# Patient Record
Sex: Male | Born: 1950 | Hispanic: Refuse to answer | Marital: Single
Health system: Southern US, Community
[De-identification: ages and names within clinical notes are randomized; demographics above are authoritative.]

## PROBLEM LIST (undated history)

## (undated) DIAGNOSIS — I10 Essential (primary) hypertension: Secondary | ICD-10-CM

---

## 2018-05-13 ENCOUNTER — Emergency Department (HOSPITAL_COMMUNITY): Payer: Self-pay | Admitting: Anesthesiology

## 2018-05-13 ENCOUNTER — Emergency Department (HOSPITAL_COMMUNITY): Payer: Self-pay

## 2018-05-13 ENCOUNTER — Encounter (HOSPITAL_COMMUNITY)
Admission: EM | Disposition: A | Discharge status: Home / Self Care | Payer: Self-pay | Source: Home / Self Care | Attending: Emergency Medicine

## 2018-05-13 ENCOUNTER — Encounter (HOSPITAL_COMMUNITY): Payer: Self-pay | Admitting: Emergency Medicine

## 2018-05-13 ENCOUNTER — Ambulatory Visit (HOSPITAL_COMMUNITY)
Admission: EM | Admit: 2018-05-13 | Discharge: 2018-05-13 | Disposition: A | Discharge status: Home / Self Care | Payer: Self-pay | Attending: Emergency Medicine | Admitting: Emergency Medicine

## 2018-05-13 DIAGNOSIS — Z7982 Long term (current) use of aspirin: Secondary | ICD-10-CM | POA: Insufficient documentation

## 2018-05-13 DIAGNOSIS — Z79899 Other long term (current) drug therapy: Secondary | ICD-10-CM | POA: Insufficient documentation

## 2018-05-13 DIAGNOSIS — S86821A Laceration of other muscle(s) and tendon(s) at lower leg level, right leg, initial encounter: Secondary | ICD-10-CM | POA: Insufficient documentation

## 2018-05-13 DIAGNOSIS — T1490XA Injury, unspecified, initial encounter: Secondary | ICD-10-CM

## 2018-05-13 DIAGNOSIS — I10 Essential (primary) hypertension: Secondary | ICD-10-CM | POA: Insufficient documentation

## 2018-05-13 DIAGNOSIS — W293XXA Contact with powered garden and outdoor hand tools and machinery, initial encounter: Secondary | ICD-10-CM

## 2018-05-13 DIAGNOSIS — S81811A Laceration without foreign body, right lower leg, initial encounter: Secondary | ICD-10-CM | POA: Insufficient documentation

## 2018-05-13 DIAGNOSIS — W458XXA Other foreign body or object entering through skin, initial encounter: Secondary | ICD-10-CM | POA: Insufficient documentation

## 2018-05-13 HISTORY — PX: I & D EXTREMITY: SHX5045

## 2018-05-13 HISTORY — DX: Essential (primary) hypertension: I10

## 2018-05-13 LAB — ABO/RH: ABO/RH(D): A POS

## 2018-05-13 LAB — BASIC METABOLIC PANEL
ANION GAP: 7 (ref 5–15)
BUN: 13 mg/dL (ref 8–23)
CO2: 23 mmol/L (ref 22–32)
Calcium: 9.2 mg/dL (ref 8.9–10.3)
Chloride: 104 mmol/L (ref 98–111)
Creatinine, Ser: 0.82 mg/dL (ref 0.61–1.24)
GFR calc Af Amer: >60 mL/min (ref >60)
GFR calc non Af Amer: >60 mL/min (ref >60)
Glucose, Bld: 146 mg/dL — ABNORMAL HIGH (ref 70–99)
Potassium: 3.6 mmol/L (ref 3.5–5.1)
Sodium: 134 mmol/L — ABNORMAL LOW (ref 135–145)

## 2018-05-13 LAB — CBC
HCT: 41.9 % (ref 39.0–52.0)
Hemoglobin: 13.9 g/dL (ref 13.0–17.0)
MCH: 31.4 pg (ref 26.0–34.0)
MCHC: 33.2 g/dL (ref 30.0–36.0)
MCV: 94.8 fL (ref 80.0–100.0)
NRBC: 0 % (ref 0.0–0.2)
Platelets: 258 10*3/uL (ref 150–400)
RBC: 4.42 MIL/uL (ref 4.22–5.81)
RDW: 12.9 % (ref 11.5–15.5)
WBC: 11.6 10*3/uL — ABNORMAL HIGH (ref 4.0–10.5)

## 2018-05-13 LAB — TYPE AND SCREEN
ABO/RH(D): A POS
ANTIBODY SCREEN: NEGATIVE

## 2018-05-13 LAB — PROTIME-INR
INR: 0.9
Prothrombin Time: 12.1 s (ref 11.4–15.2)

## 2018-05-13 LAB — CBG MONITORING, ED: Glucose-Capillary: 86 mg/dL (ref 70–99)

## 2018-05-13 SURGERY — IRRIGATION AND DEBRIDEMENT EXTREMITY
Anesthesia: General | Laterality: Right

## 2018-05-13 MED ORDER — VANCOMYCIN HCL 1000 MG IV SOLR
INTRAVENOUS | Status: AC
  Filled 2018-05-13: qty 1000

## 2018-05-13 MED ORDER — BUPIVACAINE HCL (PF) 0.25 % IJ SOLN
INTRAMUSCULAR | Status: AC
  Filled 2018-05-13: qty 30

## 2018-05-13 MED ORDER — FENTANYL CITRATE (PF) 250 MCG/5ML IJ SOLN
INTRAMUSCULAR | Status: DC | PRN
  Administered 2018-05-13: 50 ug via INTRAVENOUS
  Administered 2018-05-13: 100 ug via INTRAVENOUS

## 2018-05-13 MED ORDER — MORPHINE SULFATE (PF) 4 MG/ML IV SOLN
4.0000 mg | Freq: Once | INTRAVENOUS | Status: AC
  Administered 2018-05-13: 4 mg via INTRAVENOUS
  Filled 2018-05-13: qty 1

## 2018-05-13 MED ORDER — PROPOFOL 10 MG/ML IV BOLUS
INTRAVENOUS | Status: DC | PRN
  Administered 2018-05-13: 160 mg via INTRAVENOUS

## 2018-05-13 MED ORDER — ROCURONIUM BROMIDE 50 MG/5ML IV SOSY
PREFILLED_SYRINGE | INTRAVENOUS | Status: AC
  Filled 2018-05-13: qty 5

## 2018-05-13 MED ORDER — MIDAZOLAM HCL 5 MG/5ML IJ SOLN
INTRAMUSCULAR | Status: DC | PRN
  Administered 2018-05-13 (×2): 1 mg via INTRAVENOUS

## 2018-05-13 MED ORDER — VANCOMYCIN HCL 1000 MG IV SOLR
INTRAVENOUS | Status: DC | PRN
  Administered 2018-05-13: 1000 mg

## 2018-05-13 MED ORDER — TOBRAMYCIN SULFATE 1.2 G IJ SOLR
INTRAMUSCULAR | Status: AC
  Filled 2018-05-13: qty 1.2

## 2018-05-13 MED ORDER — MEPERIDINE HCL 50 MG/ML IJ SOLN: 6.2500 mg | INTRAMUSCULAR | Status: DC | PRN

## 2018-05-13 MED ORDER — PROPOFOL 10 MG/ML IV BOLUS
INTRAVENOUS | Status: AC
  Filled 2018-05-13: qty 20

## 2018-05-13 MED ORDER — ONDANSETRON HCL 4 MG/2ML IJ SOLN
4.0000 mg | Freq: Once | INTRAMUSCULAR | Status: AC
  Administered 2018-05-13: 4 mg via INTRAVENOUS
  Filled 2018-05-13: qty 2

## 2018-05-13 MED ORDER — EPHEDRINE 5 MG/ML INJ
INTRAVENOUS | Status: AC
  Filled 2018-05-13: qty 10

## 2018-05-13 MED ORDER — OXYCODONE HCL 5 MG PO TABS
5.0000 mg | ORAL_TABLET | Freq: Once | ORAL | Status: AC
  Administered 2018-05-13: 5 mg via ORAL

## 2018-05-13 MED ORDER — CEFAZOLIN SODIUM-DEXTROSE 2-3 GM-%(50ML) IV SOLR
INTRAVENOUS | Status: DC | PRN
  Administered 2018-05-13: 2 g via INTRAVENOUS

## 2018-05-13 MED ORDER — SODIUM CHLORIDE 0.9 % IV BOLUS (SEPSIS)
500.0000 mL | Freq: Once | INTRAVENOUS | Status: AC
  Administered 2018-05-13: 500 mL via INTRAVENOUS

## 2018-05-13 MED ORDER — ARTIFICIAL TEARS OPHTHALMIC OINT
TOPICAL_OINTMENT | OPHTHALMIC | Status: AC
  Filled 2018-05-13: qty 3.5

## 2018-05-13 MED ORDER — DEXAMETHASONE SODIUM PHOSPHATE 10 MG/ML IJ SOLN
INTRAMUSCULAR | Status: AC
  Filled 2018-05-13: qty 1

## 2018-05-13 MED ORDER — OXYCODONE HCL 5 MG PO TABS: ORAL_TABLET | ORAL | 0 refills | Status: AC

## 2018-05-13 MED ORDER — TOBRAMYCIN SULFATE 1.2 G IJ SOLR
INTRAMUSCULAR | Status: DC | PRN
  Administered 2018-05-13: 1.2 g

## 2018-05-13 MED ORDER — BUPIVACAINE HCL (PF) 0.25 % IJ SOLN
INTRAMUSCULAR | Status: AC
  Filled 2018-05-13: qty 10

## 2018-05-13 MED ORDER — ONDANSETRON HCL 4 MG/2ML IJ SOLN
INTRAMUSCULAR | Status: DC | PRN
  Administered 2018-05-13: 4 mg via INTRAVENOUS

## 2018-05-13 MED ORDER — HYDROMORPHONE HCL 1 MG/ML IJ SOLN: 0.2500 mg | INTRAMUSCULAR | Status: DC | PRN

## 2018-05-13 MED ORDER — ONDANSETRON HCL 4 MG/2ML IJ SOLN
INTRAMUSCULAR | Status: AC
  Filled 2018-05-13: qty 2

## 2018-05-13 MED ORDER — PROMETHAZINE HCL 25 MG/ML IJ SOLN: 6.2500 mg | INTRAMUSCULAR | Status: DC | PRN

## 2018-05-13 MED ORDER — SUCCINYLCHOLINE CHLORIDE 20 MG/ML IJ SOLN
INTRAMUSCULAR | Status: DC | PRN
  Administered 2018-05-13: 100 mg via INTRAVENOUS

## 2018-05-13 MED ORDER — SODIUM CHLORIDE 0.9 % IR SOLN
Status: DC | PRN
  Administered 2018-05-13 (×3): 3000 mL

## 2018-05-13 MED ORDER — ONDANSETRON HCL 4 MG PO TABS: 4.0000 mg | ORAL_TABLET | Freq: Three times a day (TID) | ORAL | 1 refills | Status: AC | PRN

## 2018-05-13 MED ORDER — LACTATED RINGERS IV SOLN: INTRAVENOUS | Status: DC

## 2018-05-13 MED ORDER — BUPIVACAINE HCL 0.25 % IJ SOLN
INTRAMUSCULAR | Status: DC | PRN
  Administered 2018-05-13: 30 mL

## 2018-05-13 MED ORDER — LACTATED RINGERS IV SOLN
INTRAVENOUS | Status: DC | PRN
  Administered 2018-05-13 (×2): via INTRAVENOUS

## 2018-05-13 MED ORDER — SUCCINYLCHOLINE CHLORIDE 200 MG/10ML IV SOSY
PREFILLED_SYRINGE | INTRAVENOUS | Status: AC
  Filled 2018-05-13: qty 10

## 2018-05-13 MED ORDER — FENTANYL CITRATE (PF) 250 MCG/5ML IJ SOLN
INTRAMUSCULAR | Status: AC
  Filled 2018-05-13: qty 5

## 2018-05-13 MED ORDER — MIDAZOLAM HCL 2 MG/2ML IJ SOLN
INTRAMUSCULAR | Status: AC
  Filled 2018-05-13: qty 2

## 2018-05-13 MED ORDER — ACETAMINOPHEN 500 MG PO TABS: 1000.0000 mg | ORAL_TABLET | Freq: Three times a day (TID) | ORAL | 0 refills | Status: AC

## 2018-05-13 MED ORDER — LIDOCAINE HCL (CARDIAC) PF 100 MG/5ML IV SOSY
PREFILLED_SYRINGE | INTRAVENOUS | Status: DC | PRN
  Administered 2018-05-13: 60 mg via INTRATRACHEAL

## 2018-05-13 MED ORDER — ASPIRIN EC 325 MG PO TBEC: 325.0000 mg | DELAYED_RELEASE_TABLET | Freq: Every day | ORAL | 0 refills | Status: AC

## 2018-05-13 MED ORDER — TOBRAMYCIN SULFATE 80 MG/2ML IJ SOLN
INTRAMUSCULAR | Status: AC
  Filled 2018-05-13: qty 2

## 2018-05-13 MED ORDER — LIDOCAINE 2% (20 MG/ML) 5 ML SYRINGE
INTRAMUSCULAR | Status: AC
  Filled 2018-05-13: qty 5

## 2018-05-13 MED ORDER — SODIUM CHLORIDE (PF) 0.9 % IJ SOLN
INTRAMUSCULAR | Status: AC
  Filled 2018-05-13: qty 10

## 2018-05-13 MED ORDER — OXYCODONE HCL 5 MG PO TABS
ORAL_TABLET | ORAL | Status: AC
  Filled 2018-05-13: qty 1

## 2018-05-13 MED ORDER — SODIUM CHLORIDE 0.9 % IV SOLN: 1000.0000 mL | INTRAVENOUS | Status: DC

## 2018-05-13 MED ORDER — EPHEDRINE SULFATE 50 MG/ML IJ SOLN
INTRAMUSCULAR | Status: DC | PRN
  Administered 2018-05-13: 10 mg via INTRAVENOUS

## 2018-05-13 SURGICAL SUPPLY — 57 items
BANDAGE ACE 4X5 VEL STRL LF (GAUZE/BANDAGES/DRESSINGS) ×3 IMPLANT
BANDAGE ACE 6X5 VEL STRL LF (GAUZE/BANDAGES/DRESSINGS) ×9 IMPLANT
BNDG COHESIVE 6X5 TAN STRL LF (GAUZE/BANDAGES/DRESSINGS) ×3 IMPLANT
BNDG GAUZE ELAST 4 BULKY (GAUZE/BANDAGES/DRESSINGS) ×3 IMPLANT
CONT SPEC 4OZ CLIKSEAL STRL BL (MISCELLANEOUS) IMPLANT
COVER SURGICAL LIGHT HANDLE (MISCELLANEOUS) ×3 IMPLANT
COVER WAND RF STERILE (DRAPES) ×3 IMPLANT
CUFF TOURN SGL LL 12 NO SLV (MISCELLANEOUS) IMPLANT
CUFF TOURNIQUET SINGLE 34IN LL (TOURNIQUET CUFF) IMPLANT
DRAPE U-SHAPE 47X51 STRL (DRAPES) ×3 IMPLANT
DRSG PAD ABDOMINAL 8X10 ST (GAUZE/BANDAGES/DRESSINGS) ×6 IMPLANT
DURAPREP 26ML APPLICATOR (WOUND CARE) IMPLANT
ELECT REM PT RETURN 9FT ADLT (ELECTROSURGICAL)
ELECTRODE REM PT RTRN 9FT ADLT (ELECTROSURGICAL) IMPLANT
GAUZE SPONGE 4X4 12PLY STRL (GAUZE/BANDAGES/DRESSINGS) ×3 IMPLANT
GAUZE XEROFORM 1X8 LF (GAUZE/BANDAGES/DRESSINGS) ×3 IMPLANT
GAUZE XEROFORM 5X9 LF (GAUZE/BANDAGES/DRESSINGS) ×3 IMPLANT
GLOVE BIOGEL PI IND STRL 6.5 (GLOVE) ×1 IMPLANT
GLOVE BIOGEL PI IND STRL 7.0 (GLOVE) ×1 IMPLANT
GLOVE BIOGEL PI IND STRL 7.5 (GLOVE) ×1 IMPLANT
GLOVE BIOGEL PI IND STRL 8 (GLOVE) ×3 IMPLANT
GLOVE BIOGEL PI INDICATOR 6.5 (GLOVE) ×2
GLOVE BIOGEL PI INDICATOR 7.0 (GLOVE) ×2
GLOVE BIOGEL PI INDICATOR 7.5 (GLOVE) ×2
GLOVE BIOGEL PI INDICATOR 8 (GLOVE) ×6
GLOVE BIOGEL PI ORTHO PRO SZ8 (GLOVE) ×4
GLOVE ECLIPSE 8.0 STRL XLNG CF (GLOVE) ×12 IMPLANT
GLOVE PI ORTHO PRO STRL SZ8 (GLOVE) ×2 IMPLANT
GOWN STRL REUS W/ TWL LRG LVL3 (GOWN DISPOSABLE) ×1 IMPLANT
GOWN STRL REUS W/ TWL XL LVL3 (GOWN DISPOSABLE) ×2 IMPLANT
GOWN STRL REUS W/TWL LRG LVL3 (GOWN DISPOSABLE) ×2
GOWN STRL REUS W/TWL XL LVL3 (GOWN DISPOSABLE) ×4
HANDPIECE INTERPULSE COAX TIP (DISPOSABLE)
KIT BASIN OR (CUSTOM PROCEDURE TRAY) ×3 IMPLANT
KIT TURNOVER KIT B (KITS) ×3 IMPLANT
MANIFOLD NEPTUNE II (INSTRUMENTS) ×3 IMPLANT
NEEDLE HYPO 25GX1X1/2 BEV (NEEDLE) ×3 IMPLANT
NS IRRIG 1000ML POUR BTL (IV SOLUTION) ×3 IMPLANT
PACK ORTHO EXTREMITY (CUSTOM PROCEDURE TRAY) ×3 IMPLANT
PAD ARMBOARD 7.5X6 YLW CONV (MISCELLANEOUS) ×3 IMPLANT
PAD CAST 4YDX4 CTTN HI CHSV (CAST SUPPLIES) ×2 IMPLANT
PADDING CAST COTTON 4X4 STRL (CAST SUPPLIES) ×4
PADDING CAST COTTON 6X4 STRL (CAST SUPPLIES) ×3 IMPLANT
SET HNDPC FAN SPRY TIP SCT (DISPOSABLE) IMPLANT
SPONGE LAP 18X18 X RAY DECT (DISPOSABLE) ×3 IMPLANT
SUT ETHILON 2 0 FS 18 (SUTURE) ×12 IMPLANT
SUT ETHILON 2 LR (SUTURE) ×3 IMPLANT
SUT ETHILON 3 0 PS 1 (SUTURE) ×3 IMPLANT
SUT PDS AB 0 CT 36 (SUTURE) ×6 IMPLANT
SWAB CULTURE ESWAB REG 1ML (MISCELLANEOUS) IMPLANT
SYR CONTROL 10ML LL (SYRINGE) ×3 IMPLANT
TOWEL OR 17X24 6PK STRL BLUE (TOWEL DISPOSABLE) ×3 IMPLANT
TOWEL OR 17X26 10 PK STRL BLUE (TOWEL DISPOSABLE) ×3 IMPLANT
TUBE CONNECTING 12'X1/4 (SUCTIONS) ×1
TUBE CONNECTING 12X1/4 (SUCTIONS) ×2 IMPLANT
UNDERPAD 30X30 (UNDERPADS AND DIAPERS) ×3 IMPLANT
YANKAUER SUCT BULB TIP NO VENT (SUCTIONS) ×3 IMPLANT

## 2018-05-13 NOTE — Anesthesia Preprocedure Evaluation (Addendum)
Anesthesia Evaluation  Patient identified by MRN, date of birth, ID band Patient awake    Reviewed: Allergy & Precautions, NPO status , Patient's Chart, lab work & pertinent test results, reviewed documented beta blocker date and time   Airway Mallampati: III  TM Distance: >3 FB Neck ROM: Full    Dental  (+) Teeth Intact, Dental Advisory Given   Pulmonary neg pulmonary ROS,    breath sounds clear to auscultation       Cardiovascular hypertension, Pt. on medications and Pt. on home beta blockers  Rhythm:Regular Rate:Normal     Neuro/Psych negative neurological ROS     GI/Hepatic negative GI ROS, Neg liver ROS,   Endo/Other  negative endocrine ROS  Renal/GU negative Renal ROS     Musculoskeletal negative musculoskeletal ROS (+)   Abdominal Normal abdominal exam  (+)   Peds  Hematology negative hematology ROS (+)   Anesthesia Other Findings   Reproductive/Obstetrics                            Anesthesia Physical Anesthesia Plan  ASA: II and emergent  Anesthesia Plan: General   Post-op Pain Management:    Induction: Intravenous, Rapid sequence and Cricoid pressure planned  PONV Risk Score and Plan: 3 and Ondansetron, Dexamethasone and Midazolam  Airway Management Planned: Oral ETT  Additional Equipment: None  Intra-op Plan:   Post-operative Plan: Extubation in OR  Informed Consent: I have reviewed the patients History and Physical, chart, labs and discussed the procedure including the risks, benefits and alternatives for the proposed anesthesia with the patient or authorized representative who has indicated his/her understanding and acceptance.   Dental advisory given  Plan Discussed with: CRNA  Anesthesia Plan Comments:         Anesthesia Quick Evaluation

## 2018-05-13 NOTE — ED Provider Notes (Signed)
[facility] EMERGENCY DEPARTMENT Provider Note   CSN: [id number] Arrival date & time: 05/13/18  1820     History   Chief Complaint Chainsaw injury  HPI Wesley Olsen is a 68 y.o. male.  HPI Patient works as an Haematologist.  He was using a chainsaw when the chainsaw kicked back and caused a severe laceration to his lower leg with visible muscle exposed.  Patient denies any distal numbness or weakness.  Paramedics were contacted.  Patient was given a gram of Ancef.  EMS placed a tourniquet and it was on for at least 30 minutes prior to his arrival Past Medical History:  Diagnosis Date  . Hypertension     There are no active problems to display for this patient.   History reviewed. No pertinent surgical history.      Home Medications    Prior to Admission medications   Not on File    Family History History reviewed. No pertinent family history.  Social History Social History   Tobacco Use  . Smoking status: Not on file  Substance Use Topics  . Alcohol use: Not on file  . Drug use: Not on file     Allergies   Patient has no known allergies.   Review of Systems Review of Systems  All other systems reviewed and are negative.    Physical Exam Updated Vital Signs BP 107/66   Pulse 66   Temp 99 F (37.2 C)   Resp 18   SpO2 100%   Physical Exam Vitals signs and nursing note reviewed.  Constitutional:      General: He is not in acute distress.    Appearance: He is well-developed.  HENT:     Head: Normocephalic and atraumatic.     Right Ear: External ear normal.     Left Ear: External ear normal.  Eyes:     General: No scleral icterus.       Right eye: No discharge.        Left eye: No discharge.     Conjunctiva/sclera: Conjunctivae normal.  Neck:     Musculoskeletal: Neck supple.     Trachea: No tracheal deviation.  Cardiovascular:     Rate and Rhythm: Normal rate and regular rhythm.  Pulmonary:     Effort: Pulmonary  effort is normal. No respiratory distress.     Breath sounds: No stridor.  Abdominal:     General: There is no distension.  Musculoskeletal:        General: Deformity present. No swelling.     Comments: Large laceration extending from the knee down to the distal third of the lower leg involving the gastrocnemius muscle, laceration does involve the muscle.  See attached image, Cabbell dorsalis pedis pulse  Skin:    General: Skin is warm and dry.     Findings: No rash.  Neurological:     Mental Status: He is alert.     Cranial Nerves: Cranial nerve deficit: no gross deficits.     Comments: Patient does have sensation in his foot, he is able to plantarflex, dorsiflex and wiggle his toes        ED Treatments / Results  Labs (all labs ordered are listed, but only abnormal results are displayed) Labs Reviewed  CBC  BASIC METABOLIC PANEL  PROTIME-INR  TYPE AND SCREEN     Radiology No results found.  Procedures Procedures (including critical care time)  Medications Ordered in ED Medications  sodium chloride 0.9 %  bolus 500 mL (500 mLs Intravenous New Bag/Given 05/13/18 1834)    Followed by  0.9 %  sodium chloride infusion (has no administration in time range)  morphine 4 MG/ML injection 4 mg (4 mg Intravenous Given 05/13/18 1827)  ondansetron (ZOFRAN) injection 4 mg (4 mg Intravenous Given 05/13/18 1827)     Initial Impression / Assessment and Plan / ED Course  I have reviewed the triage vital signs and the nursing notes.  Pertinent labs & imaging results that were available during my care of the patient were reviewed by me and considered in my medical decision making (see chart for details).  Clinical Course as of May 13 1850  Wynelle Link May 13, 2018  1850 Patient tourniquet was removed when he arrived   [JK]    Clinical Course User Index [JK] Linwood Dibbles, MD  Patient has a large laceration of his lower leg that involves the gastrocnemius muscle.  Pt will need OR irrigation and  repair. Case d/w Dr Everardo Pacific.  He will plan on taking him to the OR  Final Clinical Impressions(s) / ED Diagnoses   Final diagnoses:  Injury  Contact with chainsaw as cause of accidental injury    ED Discharge Orders    None       Linwood Dibbles, MD 05/13/18 [phone]

## 2018-05-13 NOTE — Progress Notes (Signed)
Discharge instruction given to Pt. And his daughter.  Printed out AVS spanish , also reminded his daughter to stay with Pt. 24hrs on discharge. VSS. Drank water without nausea.  Discharge home with family. Daughter requested throw away pt's clothes .

## 2018-05-13 NOTE — Transfer of Care (Signed)
Immediate Anesthesia Transfer of Care Note  Patient: Wesley Olsen  Procedure(s) Performed: IRRIGATION AND DEBRIDEMENT RIGHT LEG (Right )  Patient Location: PACU  Anesthesia Type:General  Level of Consciousness: drowsy  Airway & Oxygen Therapy: Patient Spontanous Breathing and Patient connected to face mask oxygen  Post-op Assessment: Report given to RN and Post -op Vital signs reviewed and stable  Post vital signs: Reviewed and stable  Last Vitals:  Vitals Value Taken Time  BP 122/70 05/13/2018  8:46 PM  Temp    Pulse 104 05/13/2018  8:48 PM  Resp 13 05/13/2018  8:48 PM  SpO2 98 % 05/13/2018  8:48 PM  Vitals shown include unvalidated device data.  Last Pain: There were no vitals filed for this visit.       Complications: No apparent anesthesia complications

## 2018-05-13 NOTE — Anesthesia Procedure Notes (Signed)
Procedure Name: Intubation Date/Time: 05/13/2018 7:37 PM Performed by: Claudina Lick, CRNA Pre-anesthesia Checklist: Patient identified, Emergency Drugs available, Suction available, Patient being monitored and Timeout performed Patient Re-evaluated:Patient Re-evaluated prior to induction Oxygen Delivery Method: Circle system utilized Preoxygenation: Pre-oxygenation with 100% oxygen Induction Type: IV induction, Rapid sequence and Cricoid Pressure applied Laryngoscope Size: Miller and 2 Grade View: Grade I Tube type: Oral Tube size: 7.5 mm Number of attempts: 1 Airway Equipment and Method: Stylet Placement Confirmation: ETT inserted through vocal cords under direct vision,  positive ETCO2 and breath sounds checked- equal and bilateral Secured at: 22 cm Tube secured with: Tape Dental Injury: Teeth and Oropharynx as per pre-operative assessment

## 2018-05-13 NOTE — ED Triage Notes (Signed)
Pt here from home with a lac  To the right lower calf , arrived from ems with tourniquet applied pt received 1 gram of ancef by ems

## 2018-05-13 NOTE — Op Note (Signed)
Orthopaedic Surgery Operative Note (CSN: [id number])  [name]  1950/12/21 Date of Surgery: 05/13/2018   Diagnoses:  chain saw to right leg  Procedure: 13121 - Incision and debridement of complex lower extremity wound 25 x 10 cm  13122 x 4 - additional codes for the length of the wound 40973 - musculotendinous repair of gastroc to Achilles 11043 - debridement of muscle and fascial tissue well skin.   Operative Finding Successful completion of planned procedure.  Overall mild contamination of the deep muscle no obvious foreign material.  The medial gastroc and its complete entirety was traumatically lacerated away from the Achilles.  The soleal fascia was lacerated by the chainsaw however the muscle itself was intact.  Were able to palpate the neurovascular bundle beneath this.  The muscle itself we felt would not of healed well with this we reapproximated the gastroc to the Achilles at the musculotendinous junction.  Post-operative plan: The patient will be nonweightbearing in a splint for 2 weeks.  The patient will be evaluated 1 week afterwards for a wound check, he will be discharged home at his request.  DVT prophylaxis aspirin 325 daily.  Pain control with PRN pain medication preferring oral medicines.  Follow up plan will be scheduled in approximately 7 days for incision check.   Post-Op Diagnosis: Same Surgeons:Primary: Wesley Pippin, MD Assistants: Wesley Olsen, OPAC Location: [facility] OR ROOM 08 Anesthesia: General Antibiotics: Ancef 2g preop, Vancomycin 1000mg  locally, tobramycin 1.2 g Tourniquet time: None Estimated Blood Loss: Minimal Complications: None Specimens: None Implants: * No implants in log *  Indications for Surgery:   Wesley Olsen is a 68 y.o. male with accident resulting in a chainsaw injury to the medial lower right leg.  He was evaluated in the emergency room and required a deep washout and wound closure due to the level contamination the size of the  wound.  With help and interpreter we discussed benefits and risks of operative and nonoperative management were discussed prior to surgery with patient/guardian(s) and informed consent form was completed.  Specific risks including infection, need for additional surgery, infection, muscle dysfunction, stiffness, need for further surgery including repeat washout and possible wound VAC placement.   Procedure:   The patient was identified in the preoperative holding area where the surgical site was marked. The patient was taken to the OR where a procedural timeout was called and the above noted anesthesia was induced.  The patient was positioned supine with a bump and a bone foam.  Preoperative antibiotics were dosed.  The patient's right lower leg was prepped and draped in the usual sterile fashion.  A second preoperative timeout was called.      No tourniquet was placed.  Patient preoperatively had good pulses and a warm well perfused foot with distal motor and sensory function was intact.  We identified the large traumatic laceration of the medial posterior aspect of the calf and measured 25x 10 cm in length.  It went deep clearly lacerating the entirety of the medial side of the gastroc from the Achilles at the musculotendinous junction and involving the fascia of the soleus.  The skin was macerated around the incision itself.  We sharply debrided unhealthy appearing muscle, fascia and skin.  We irrigated with 9 L of normal saline with cystoscopy tubing and debrided carefully as we proceeded to ensure there is no foreign bodies.  We really did not encounter much in the way of foreign contaminant in the wound bed was relatively healthy  appearing.  We again after irrigation checked her wound to make sure there was no other devitalized appearing tissue and then proceeded with closure attempts.  We felt that the wound was amenable to closure and the patient requested that we try and close the wound and send the  patient home if possible as he did not want to stay in the hospital.  We placed vancomycin and tobramycin powder deep within the laceration and performed a repair with 0 PDS of the medial gastroc with interrupted sutures in a figure-of-eight fashion across the muscular tendinous junction to repair the Achilles to the medial gastroc.  We did not want to use the typical nonabsorbable sutures in the setting of possible infection.  We then performed a single layer closure of the complex laceration of the skin.  This was done in conjunction with retention sutures as well as independent interrupted sutures.   A sterile dressing was placed.  A well-padded short leg splint was placed.  The patient was awoken from general anesthesia and taken to the PACU in stable condition without complication.   Wesley Olsen, OPA-C, present and scrubbed throughout the case, critical for completion in a timely fashion, and for retraction, instrumentation, closure.

## 2018-05-13 NOTE — Consult Note (Signed)
ORTHOPAEDIC CONSULTATION  REQUESTING PHYSICIAN: Dorie Rank, MD  Chief Complaint: R leg wound  HPI: Wesley Olsen is a 68 y.o. male with chainsaw versus RLE injury with a contaminated soft tissue injury.  ER wanted orthopedics evaluation due to the complexity and depth of the wound.  Patient has remote heart history but no other reporetd medical history.  History obtained with an interpreter.   Past Medical History:  Diagnosis Date  . Hypertension    History reviewed. No pertinent surgical history. Social History   Socioeconomic History  . Marital status: Not on file    Spouse name: Not on file  . Number of children: Not on file  . Years of education: Not on file  . Highest education level: Not on file  Occupational History  . Not on file  Social Needs  . Financial resource strain: Not on file  . Food insecurity:    Worry: Not on file    Inability: Not on file  . Transportation needs:    Medical: Not on file    Non-medical: Not on file  Tobacco Use  . Smoking status: Not on file  Substance and Sexual Activity  . Alcohol use: Not on file  . Drug use: Not on file  . Sexual activity: Not on file  Lifestyle  . Physical activity:    Days per week: Not on file    Minutes per session: Not on file  . Stress: Not on file  Relationships  . Social connections:    Talks on phone: Not on file    Gets together: Not on file    Attends religious service: Not on file    Active member of club or organization: Not on file    Attends meetings of clubs or organizations: Not on file    Relationship status: Not on file  Other Topics Concern  . Not on file  Social History Narrative  . Not on file   History reviewed. No pertinent family history. No Known Allergies Prior to Admission medications   Medication Sig Start Date End Date Taking? Authorizing Provider  Ascorbic Acid (VITAMIN C PO) Take 1 tablet by mouth daily.   Yes [provider]  aspirin EC 81 MG  tablet Take 81 mg by mouth daily.   Yes [provider]  atorvastatin (LIPITOR) 40 MG tablet Take 40 mg by mouth daily. 02/15/18  Yes [provider]  Cyanocobalamin (VITAMIN B-12 PO) Take 1 tablet by mouth daily.   Yes [provider]  lisinopril (PRINIVIL,ZESTRIL) 20 MG tablet Take 20 mg by mouth daily. 02/15/18  Yes [provider]  metoprolol succinate (TOPROL-XL) 50 MG 24 hr tablet Take 50 mg by mouth daily. 02/15/18  Yes [provider]  VITAMIN E PO Take 1 capsule by mouth daily.   Yes [provider]   Dg Tibia/fibula Right Port  Result Date: 05/13/2018 CLINICAL DATA:  Chainsaw injury to the medial, posterior aspect of the right lower leg. EXAM: PORTABLE RIGHT TIBIA AND FIBULA - 2 VIEW COMPARISON:  None. FINDINGS: Extensive soft tissue injury is noted to the posteromedial aspect of the right leg extending from just below the knee to the mid leg. There is no radiopaque foreign body. No fracture.  Knee and ankle joints are normally spaced and aligned. IMPRESSION: 1. Extensive soft tissue injury to the medial right leg. No radiopaque foreign body. 2. No fracture or joint abnormality Electronically Signed   By: Dedra Skeens.D.  On: 05/13/2018 18:51   Family History Reviewed and non-contributory, no pertinent history of problems with bleeding or anesthesia      Review of Systems 14 system ROS conducted and negative except for that noted in HPI   OBJECTIVE  Vitals: Patient Vitals for the past 8 hrs:  BP Temp Pulse Resp SpO2  05/13/18 1826 107/66 99 F (37.2 C) 66 18 100 %   General: Alert, no acute distress Cardiovascular: No pedal edema Respiratory: No cyanosis, no use of accessory musculature GI: No organomegaly, abdomen is soft and non-tender Skin: No lesions in the area of chief complaint other than those listed below in MSK exam.  Neurologic: Sensation intact distally save for the below mentioned MSK exam Psychiatric:  Patient is competent for consent with normal mood and affect Lymphatic: No swelling outside the above Extremities   RLE large medial wound with exposed muscle and tendon, intact distal NV status.  Wwp foot.     Test Results Imaging Trays demonstrate intact bone however large soft tissue shadow consistent with laceration  Labs cbc Recent Labs    05/13/18 1844  WBC 11.6*  HGB 13.9  HCT 41.9  PLT 258    Labs inflam No results for input(s): CRP in the last 72 hours.  Invalid input(s): ESR  Labs coag No results for input(s): INR, PTT in the last 72 hours.  Invalid input(s): PT  No results for input(s): NA, K, CL, CO2, GLUCOSE, BUN, CREATININE, CALCIUM in the last 72 hours.   ASSESSMENT AND PLAN: 68 y.o. male with the following: Right lower extremity chainsaw injury with contaminated wound and possible tendon and muscle involvement.  Help with an interpreter talk to the patient about the injury.  He will require an operative debridement and complex wound closure with possible wound VAC placement.  He may need a repeat debridement depending on the contamination level of the wound.  He would like to ideally avoid this and would like to go home after surgery if possible.  We told this is possible but we would have to see the extent of his injury more thoroughly in the operating room.  Risks including specific risk of muscle damage stiffness and considerable risk of infection were all discussed as well as the need for further surgery.  This was all discussed with an interpreter and the patient agreed to proceed.  Emergent surgery will proceed.

## 2018-05-14 ENCOUNTER — Encounter (HOSPITAL_COMMUNITY): Payer: Self-pay | Admitting: Orthopaedic Surgery

## 2018-05-15 NOTE — Anesthesia Postprocedure Evaluation (Signed)
Anesthesia Post Note  Patient: Wesley BoozeHector Olsen  Procedure(s) Performed: IRRIGATION AND DEBRIDEMENT RIGHT LEG (Right )     Patient location during evaluation: PACU Anesthesia Type: General Level of consciousness: awake and alert Pain management: pain level controlled Vital Signs Assessment: post-procedure vital signs reviewed and stable Respiratory status: spontaneous breathing, nonlabored ventilation, respiratory function stable and patient connected to nasal cannula oxygen Cardiovascular status: blood pressure returned to baseline and stable Postop Assessment: no apparent nausea or vomiting Anesthetic complications: no    Last Vitals:  Vitals:   05/13/18 2125 05/13/18 2127  BP: 135/80 135/80  Pulse: 89 90  Resp: 15 14  Temp:  36.7 C  SpO2: 100% 100%    Last Pain:  Vitals:   05/13/18 2130  PainSc: 3                  Shelton SilvasKevin D Jaasia Viglione

## 2019-10-20 IMAGING — DX DG TIBIA/FIBULA PORT 2V*R*
4 series · 4 of 4 positions shown · non-contrast
Comparison: None.

CLINICAL DATA: Chainsaw injury to the medial, posterior aspect of
the right lower leg.

EXAM:
PORTABLE RIGHT TIBIA AND FIBULA - 2 VIEW

[tibia ap (1 of 2)]
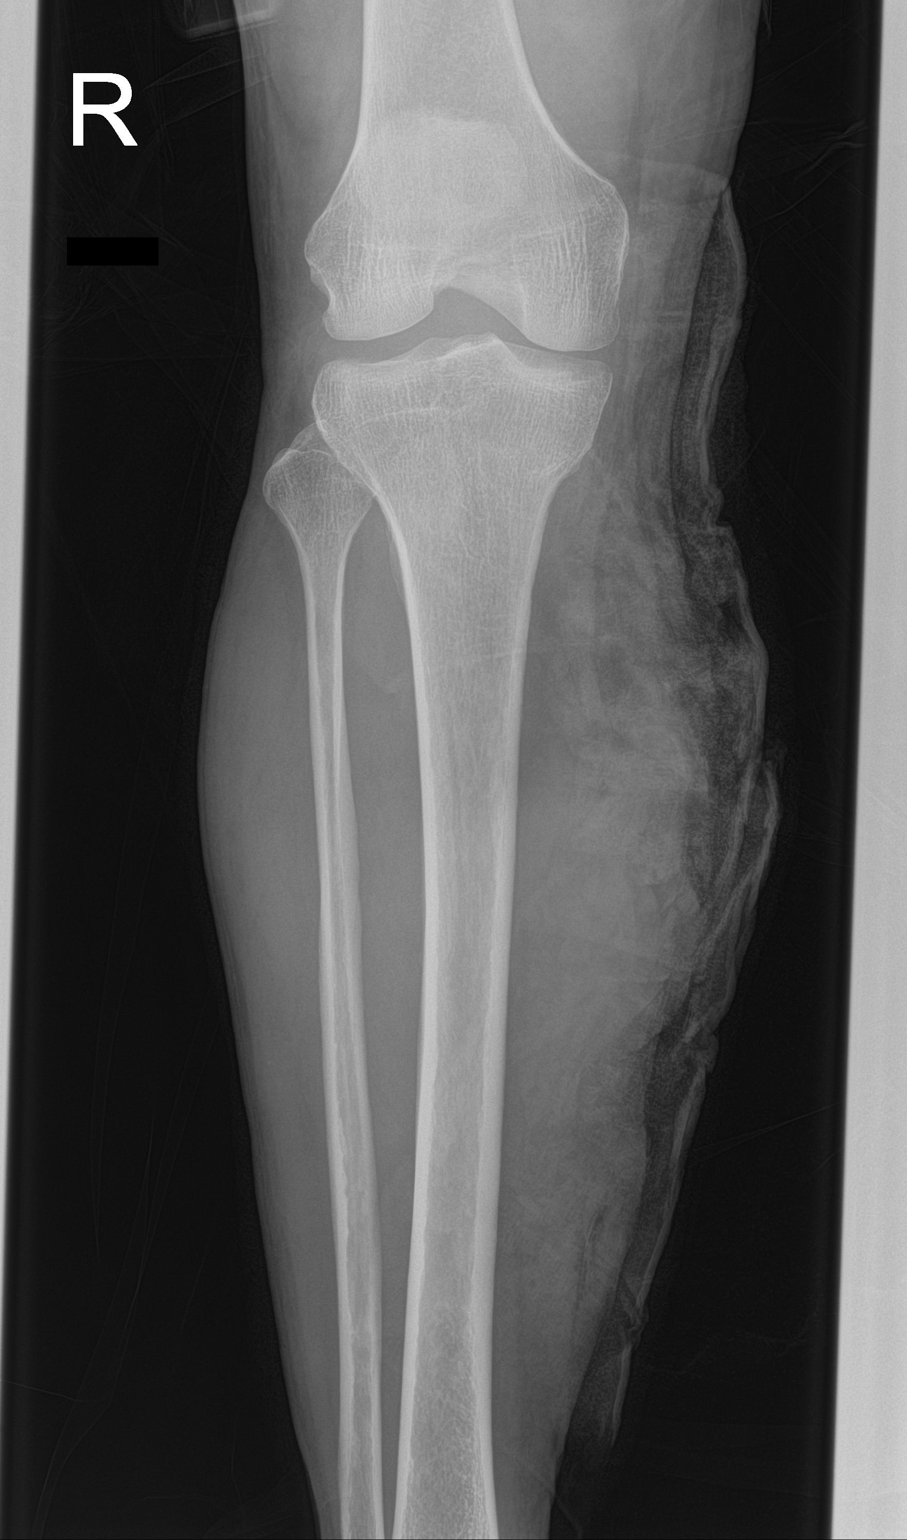

[tibia ap (2 of 2)]
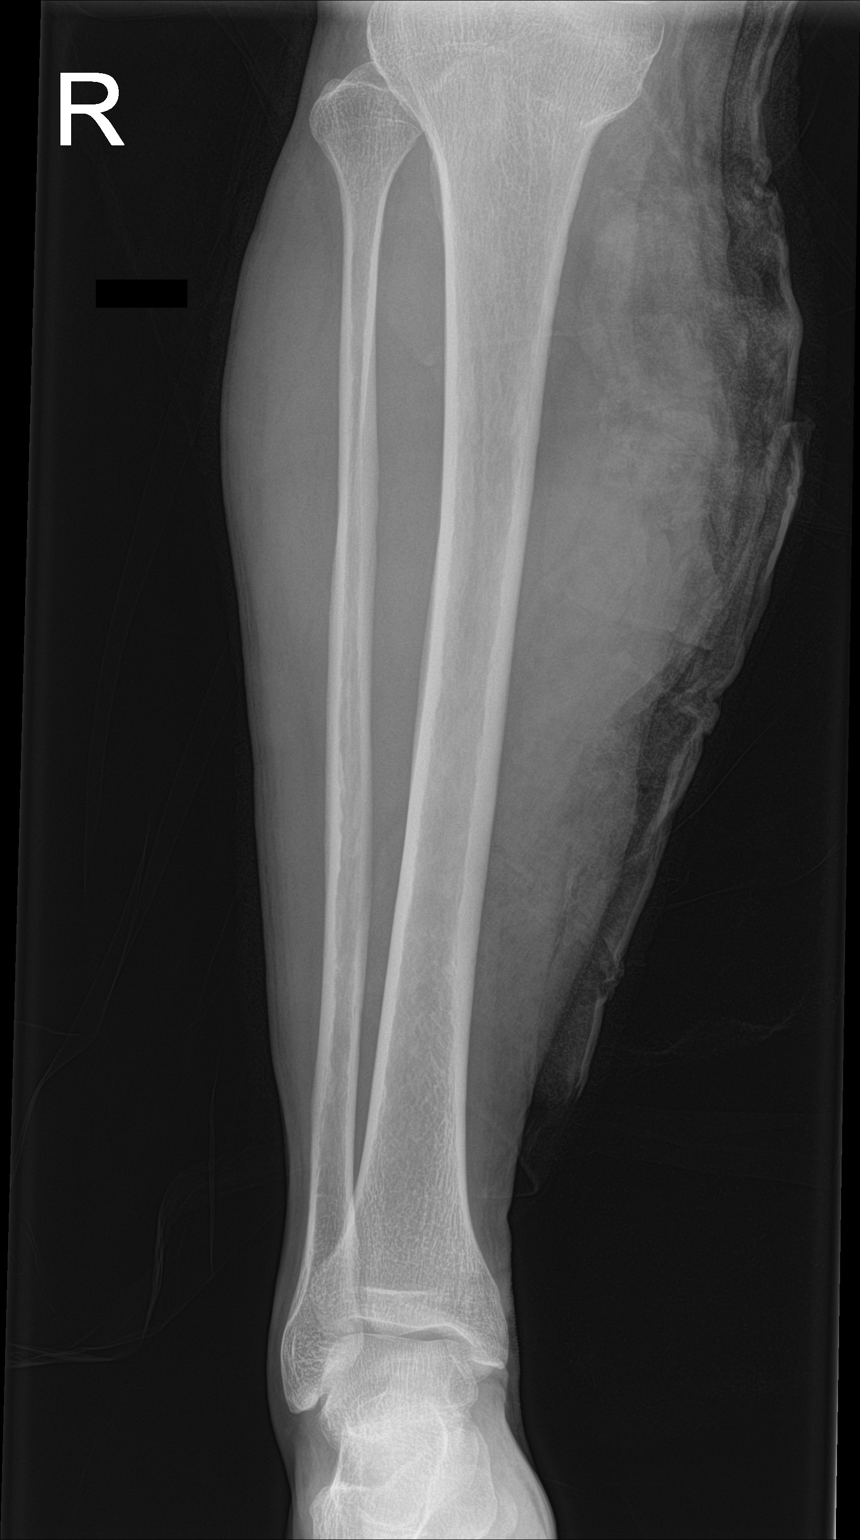

[tibia lat (1 of 2)]
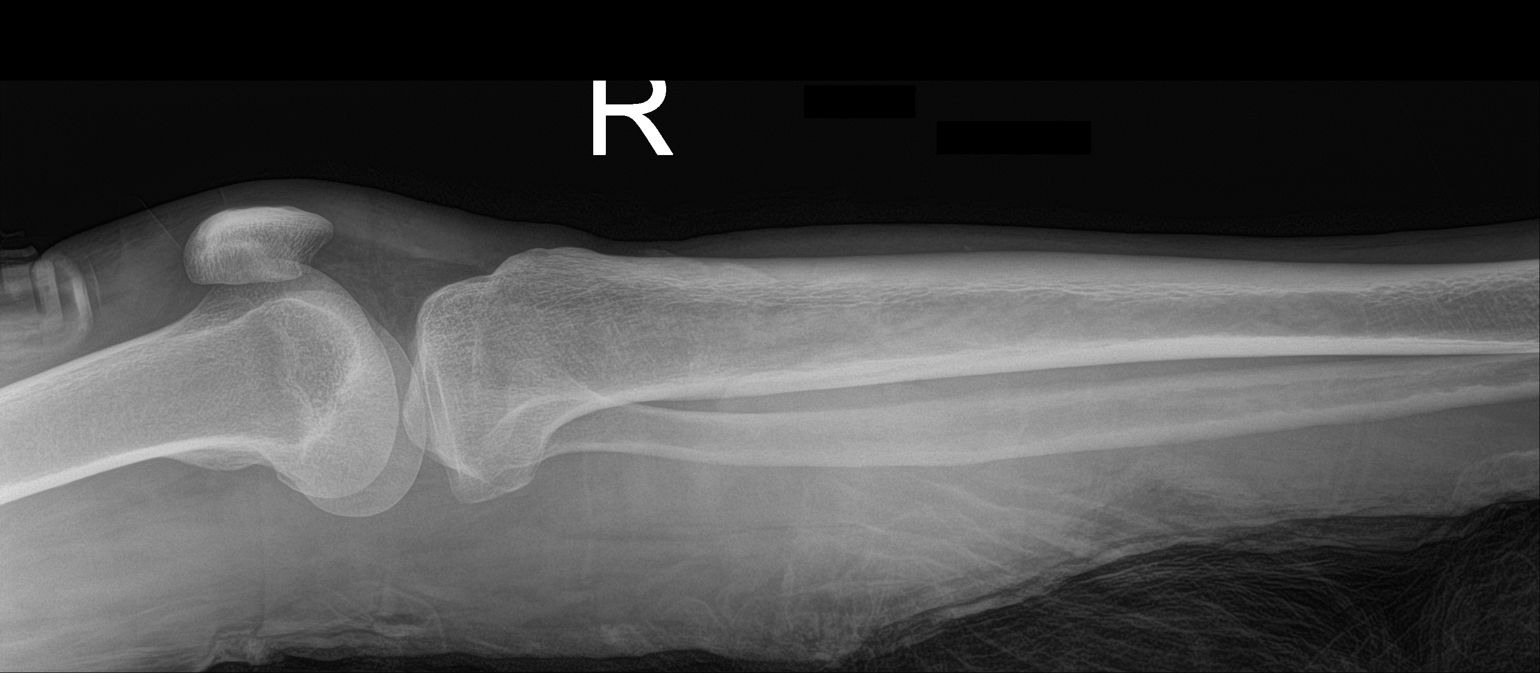

[tibia lat (2 of 2)]
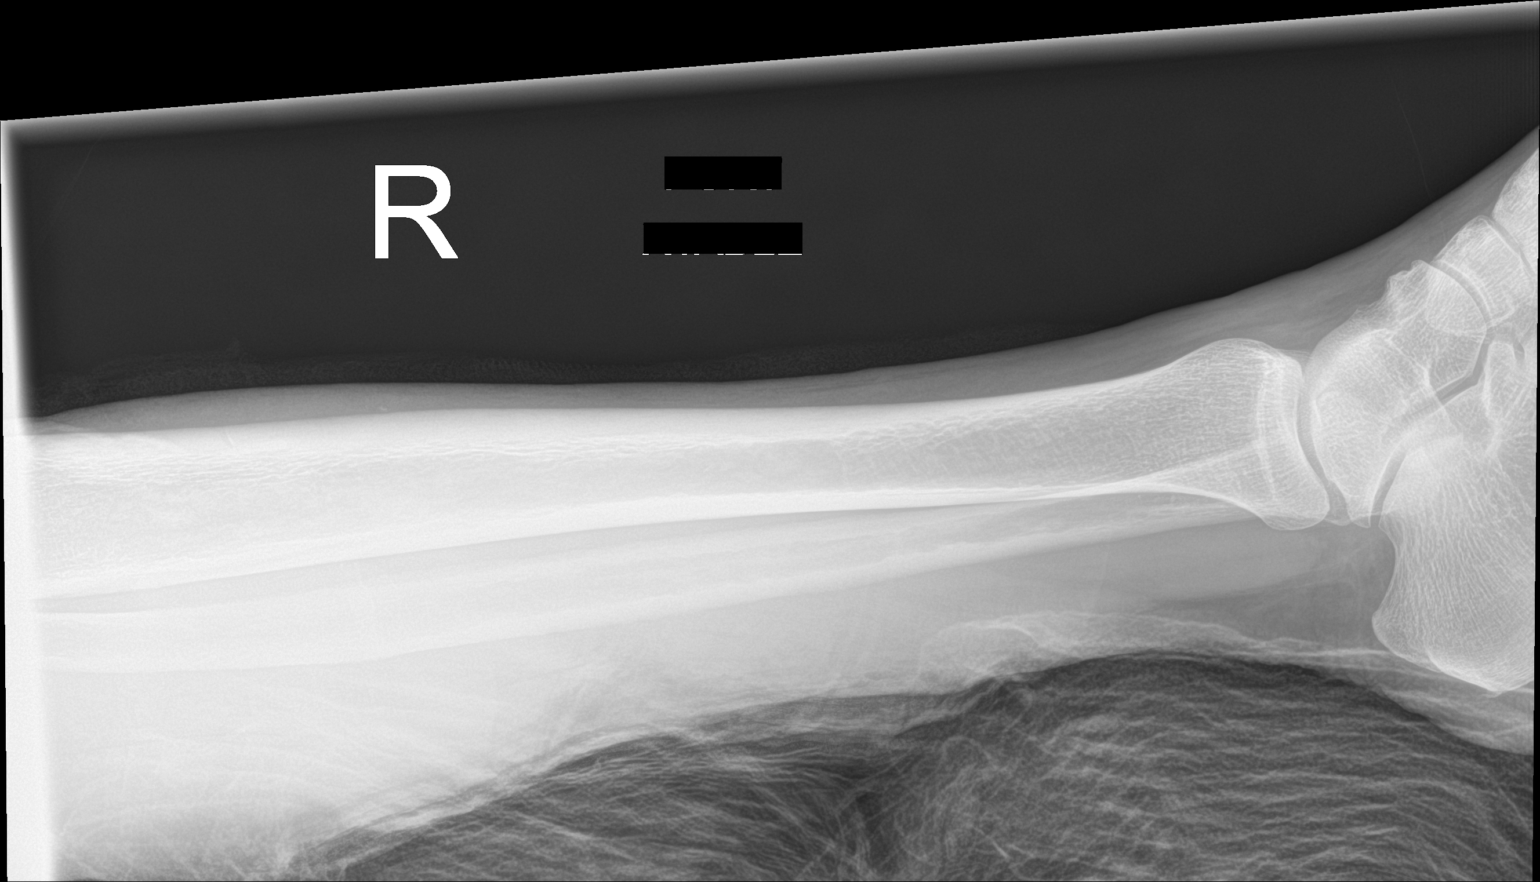

[4 of 4 positions shown; findings below may reference images not displayed]

FINDINGS: Extensive soft tissue injury is noted to the posteromedial aspect of
the right leg extending from just below the knee to the mid leg.
There is no radiopaque foreign body.

No fracture.  Knee and ankle joints are normally spaced and aligned.
IMPRESSION: 1. Extensive soft tissue injury to the medial right leg. No
radiopaque foreign body.
2. No fracture or joint abnormality
